# Patient Record
Sex: Female | Born: 1960 | Race: Black or African American | Hispanic: No | Smoking: Never smoker
Health system: Southern US, Community
[De-identification: ages and names within clinical notes are randomized; demographics above are authoritative.]

## PROBLEM LIST (undated history)

## (undated) DIAGNOSIS — F419 Anxiety disorder, unspecified: Secondary | ICD-10-CM

## (undated) HISTORY — PX: TUBAL LIGATION: SHX77

---

## 2001-07-02 ENCOUNTER — Encounter: Payer: Self-pay | Admitting: Family Medicine

## 2001-07-02 ENCOUNTER — Encounter: Admission: RE | Admit: 2001-07-02 | Discharge: 2001-07-02 | Payer: Self-pay | Admitting: Family Medicine

## 2004-11-18 ENCOUNTER — Emergency Department: Payer: Self-pay | Admitting: Emergency Medicine

## 2007-01-01 ENCOUNTER — Other Ambulatory Visit: Payer: Self-pay

## 2007-01-01 ENCOUNTER — Emergency Department: Payer: Self-pay | Admitting: Emergency Medicine

## 2007-10-24 ENCOUNTER — Emergency Department: Payer: Self-pay | Admitting: Internal Medicine

## 2007-10-24 ENCOUNTER — Other Ambulatory Visit: Payer: Self-pay

## 2008-02-25 ENCOUNTER — Emergency Department: Payer: Self-pay | Admitting: Emergency Medicine

## 2008-02-27 ENCOUNTER — Emergency Department: Payer: Self-pay | Admitting: Emergency Medicine

## 2008-02-28 ENCOUNTER — Emergency Department: Payer: Self-pay | Admitting: Emergency Medicine

## 2010-08-13 ENCOUNTER — Emergency Department (HOSPITAL_COMMUNITY): Admission: EM | Admit: 2010-08-13 | Discharge: 2010-08-13 | Payer: Self-pay | Admitting: Family Medicine

## 2010-08-13 ENCOUNTER — Emergency Department (HOSPITAL_COMMUNITY): Admission: EM | Admit: 2010-08-13 | Discharge: 2010-08-14 | Payer: Self-pay | Admitting: Emergency Medicine

## 2010-10-29 ENCOUNTER — Emergency Department (HOSPITAL_COMMUNITY)
Admission: EM | Admit: 2010-10-29 | Discharge: 2010-10-29 | Payer: Self-pay | Source: Home / Self Care | Admitting: Emergency Medicine

## 2011-01-08 LAB — POCT URINALYSIS DIPSTICK
Bilirubin Urine: NEGATIVE
Glucose, UA: NEGATIVE mg/dL
Hgb urine dipstick: NEGATIVE
Nitrite: NEGATIVE
Protein, ur: NEGATIVE mg/dL
Specific Gravity, Urine: 1.015 (ref 1.005–1.030)
Urobilinogen, UA: 0.2 mg/dL (ref 0.0–1.0)
pH: 6.5 (ref 5.0–8.0)

## 2011-01-10 LAB — POCT I-STAT, CHEM 8
BUN: 12 mg/dL (ref 6–23)
Calcium, Ion: 1.13 mmol/L (ref 1.12–1.32)
Chloride: 105 mEq/L (ref 96–112)
Creatinine, Ser: 0.9 mg/dL (ref 0.4–1.2)
Glucose, Bld: 91 mg/dL (ref 70–99)
HCT: 38 % (ref 36.0–46.0)
Hemoglobin: 12.9 g/dL (ref 12.0–15.0)
Potassium: 4.3 mEq/L (ref 3.5–5.1)
Sodium: 140 mEq/L (ref 135–145)
TCO2: 26 mmol/L (ref 0–100)

## 2011-01-10 LAB — CK: Total CK: 498 U/L — ABNORMAL HIGH (ref 7–177)

## 2011-09-11 ENCOUNTER — Emergency Department: Payer: Self-pay | Admitting: Internal Medicine

## 2012-12-10 ENCOUNTER — Emergency Department (HOSPITAL_COMMUNITY)
Admission: EM | Admit: 2012-12-10 | Discharge: 2012-12-10 | Disposition: A | Discharge status: Home / Self Care | Payer: Self-pay | Attending: Emergency Medicine | Admitting: Emergency Medicine

## 2012-12-10 ENCOUNTER — Encounter (HOSPITAL_COMMUNITY): Payer: Self-pay | Admitting: Emergency Medicine

## 2012-12-10 DIAGNOSIS — Z7982 Long term (current) use of aspirin: Secondary | ICD-10-CM | POA: Insufficient documentation

## 2012-12-10 DIAGNOSIS — M5432 Sciatica, left side: Secondary | ICD-10-CM

## 2012-12-10 DIAGNOSIS — Z79899 Other long term (current) drug therapy: Secondary | ICD-10-CM | POA: Insufficient documentation

## 2012-12-10 DIAGNOSIS — Z8659 Personal history of other mental and behavioral disorders: Secondary | ICD-10-CM | POA: Insufficient documentation

## 2012-12-10 DIAGNOSIS — M5430 Sciatica, unspecified side: Secondary | ICD-10-CM | POA: Insufficient documentation

## 2012-12-10 HISTORY — DX: Anxiety disorder, unspecified: F41.9

## 2012-12-10 MED ORDER — IBUPROFEN 800 MG PO TABS
800.0000 mg | ORAL_TABLET | Freq: Once | ORAL | Status: AC
  Administered 2012-12-10: 800 mg via ORAL
  Filled 2012-12-10: qty 1

## 2012-12-10 MED ORDER — DIAZEPAM 5 MG PO TABS: 2.5000 mg | ORAL_TABLET | Freq: Two times a day (BID) | ORAL | Status: DC

## 2012-12-10 MED ORDER — IBUPROFEN 800 MG PO TABS: 800.0000 mg | ORAL_TABLET | Freq: Three times a day (TID) | ORAL | Status: DC

## 2012-12-10 MED ORDER — DIAZEPAM 2 MG PO TABS
2.0000 mg | ORAL_TABLET | Freq: Once | ORAL | Status: AC
  Administered 2012-12-10: 2 mg via ORAL
  Filled 2012-12-10: qty 1

## 2012-12-10 NOTE — ED Notes (Signed)
Pt reports yesterday afternoon she began to have left lower back/hip pain radiating down her L leg. She is unable to put pressure on that leg.

## 2012-12-10 NOTE — ED Provider Notes (Signed)
History     CSN: [id number]  Arrival date & time 12/10/12  4098   First MD Initiated Contact with Patient 12/10/12 0407      Chief Complaint  Patient presents with  . Back Pain    (Consider location/radiation/quality/duration/timing/severity/associated sxs/prior treatment) Patient is a 52 y.o. female presenting with back pain.  Back Pain Associated symptoms: no abdominal pain, no chest pain, no dysuria, no fever and no headaches    Hx per PT. L sided back pain onset last, feels sharp in quality 7-8/10, radiates to butt/ hip/ leg. Worse with standing, walking, moving. No weakness. No F/C. Has not taken anything for this, no recalled trauma. 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 Is not diabetic. Pain constant since onset. No incontinence. No saddle paraesthesias  Past Medical History  Diagnosis Date  . Anxiety     Past Surgical History  Procedure Laterality Date  . Cesarean section      No family history on file.  History  Substance Use Topics  . Smoking status: Never Smoker   . Smokeless tobacco: Not on file  . Alcohol Use: No    OB History   Grav Para Term Preterm Abortions TAB SAB Ect Mult Living                  Review of Systems  Constitutional: Negative for fever and chills.  HENT: Negative for neck pain and neck stiffness.   Eyes: Negative for pain.  Respiratory: Negative for shortness of breath.   Cardiovascular: Negative for chest pain.  Gastrointestinal: Negative for abdominal pain.  Genitourinary: Negative for dysuria.  Musculoskeletal: Positive for back pain.  Skin: Negative for rash.  Neurological: Negative for headaches.  All other systems reviewed and are negative.    Allergies  Review of patient's allergies indicates no known allergies.  Home Medications   Current Outpatient Rx  Name  Route  Sig  Dispense  Refill  . aspirin EC 81 MG tablet   Oral   Take 81 mg by mouth daily.         Marland Kitchen GARCINIA  CAMBOGIA-CHROMIUM PO   Oral   Take 1 tablet by mouth daily.         . Multiple Vitamin (MULTIVITAMIN WITH MINERALS) TABS   Oral   Take 1 tablet by mouth daily.         Marland Kitchen OVER THE COUNTER MEDICATION   Oral   Take 1 capsule by mouth daily. tumeric           BP 129/93  Pulse 88  Temp(Src) 98.3 F (36.8 C) (Oral)  Resp 18  SpO2 100%  Physical Exam  Constitutional: She is oriented to person, place, and time. She appears well-developed and well-nourished.  HENT:  Head: Normocephalic and atraumatic.  Eyes: EOM are normal. Pupils are equal, round, and reactive to light.  Neck: Neck supple.  Cardiovascular: Normal rate, regular rhythm and intact distal pulses.   Pulmonary/Chest: Effort normal and breath sounds normal. No respiratory distress.  Abdominal: Soft. Bowel sounds are normal. She exhibits no distension. There is no tenderness.  Musculoskeletal: Normal range of motion. She exhibits no edema.  Tender over left sciatic area that reproduces her symptoms with palpation. No LE deficits with equal DTRs, strengths and sensorium to light touch. No midline lumbar tenderness, no CVAT  Neurological: She is alert and oriented to person, place, and time.  Skin: Skin is warm and dry.  ED Course  Procedures (including critical care time)  Motrin and Valium provided  On recheck starting to feel better. Sciatica precautions provided. Plan close outpatient followup  MDM  L sciatic pain and reproducible tenderness.   Medications provided  No indication for imaging based on clinical presentation/ exam  VS and nursing notes reviewed.         Sunnie Nielsen, MD 12/10/12 [phone]

## 2014-04-13 ENCOUNTER — Encounter (HOSPITAL_COMMUNITY): Payer: Self-pay | Admitting: *Deleted

## 2014-04-13 ENCOUNTER — Inpatient Hospital Stay (HOSPITAL_COMMUNITY)
Admission: AD | Admit: 2014-04-13 | Discharge: 2014-04-13 | Disposition: A | Discharge status: Home / Self Care | Payer: 59 | Source: Ambulatory Visit | Attending: Obstetrics & Gynecology | Admitting: Obstetrics & Gynecology

## 2014-04-13 DIAGNOSIS — N950 Postmenopausal bleeding: Secondary | ICD-10-CM | POA: Insufficient documentation

## 2014-04-13 DIAGNOSIS — M549 Dorsalgia, unspecified: Secondary | ICD-10-CM | POA: Diagnosis not present

## 2014-04-13 LAB — URINALYSIS, ROUTINE W REFLEX MICROSCOPIC
BILIRUBIN URINE: NEGATIVE
GLUCOSE, UA: NEGATIVE mg/dL
KETONES UR: NEGATIVE mg/dL
Nitrite: NEGATIVE
PROTEIN: NEGATIVE mg/dL
Specific Gravity, Urine: 1.025 (ref 1.005–1.030)
Urobilinogen, UA: 0.2 mg/dL (ref 0.0–1.0)
pH: 5.5 (ref 5.0–8.0)

## 2014-04-13 LAB — WET PREP, GENITAL
Clue Cells Wet Prep HPF POC: NONE SEEN
TRICH WET PREP: NONE SEEN
YEAST WET PREP: NONE SEEN

## 2014-04-13 LAB — URINE MICROSCOPIC-ADD ON

## 2014-04-13 LAB — POCT PREGNANCY, URINE: Preg Test, Ur: NEGATIVE

## 2014-04-13 MED ORDER — IBUPROFEN 600 MG PO TABS
600.0000 mg | ORAL_TABLET | Freq: Once | ORAL | Status: AC
  Administered 2014-04-13: 600 mg via ORAL
  Filled 2014-04-13: qty 1

## 2014-04-13 MED ORDER — IBUPROFEN 600 MG PO TABS: 600.0000 mg | ORAL_TABLET | Freq: Three times a day (TID) | ORAL | Status: DC | PRN

## 2014-04-13 NOTE — Discharge Instructions (Signed)
Back Pain, Adult Low back pain is very common. About 1 in 5 people have back pain.The cause of low back pain is rarely dangerous. The pain often gets better over time.About half of people with a sudden onset of back pain feel better in just 2 weeks. About 8 in 10 people feel better by 6 weeks.  CAUSES Some common causes of back pain include:  Strain of the muscles or ligaments supporting the spine.  Wear and tear (degeneration) of the spinal discs.  Arthritis.  Direct injury to the back. DIAGNOSIS Most of the time, the direct cause of low back pain is not known.However, back pain can be treated effectively even when the exact cause of the pain is unknown.Answering your caregiver's questions about your overall health and symptoms is one of the most accurate ways to make sure the cause of your pain is not dangerous. If your caregiver needs more information, he or she may order lab work or imaging tests (X-rays or MRIs).However, even if imaging tests show changes in your back, this usually does not require surgery. HOME CARE INSTRUCTIONS For many people, back pain returns.Since low back pain is rarely dangerous, it is often a condition that people can learn to manageon their own.   Remain active. It is stressful on the back to sit or stand in one place. Do not sit, drive, or stand in one place for more than 30 minutes at a time. Take short walks on level surfaces as soon as pain allows.Try to increase the length of time you walk each day.  Do not stay in bed.Resting more than 1 or 2 days can delay your recovery.  Do not avoid exercise or work.Your body is made to move.It is not dangerous to be active, even though your back may hurt.Your back will likely heal faster if you return to being active before your pain is gone.  Pay attention to your body when you bend and lift. Many people have less discomfortwhen lifting if they bend their knees, keep the load close to their bodies,and  avoid twisting. Often, the most comfortable positions are those that put less stress on your recovering back.  Find a comfortable position to sleep. Use a firm mattress and lie on your side with your knees slightly bent. If you lie on your back, put a pillow under your knees.  Only take over-the-counter or prescription medicines as directed by your caregiver. Over-the-counter medicines to reduce pain and inflammation are often the most helpful.Your caregiver may prescribe muscle relaxant drugs.These medicines help dull your pain so you can more quickly return to your normal activities and healthy exercise.  Put ice on the injured area.  Put ice in a plastic bag.  Place a towel between your skin and the bag.  Leave the ice on for 15-20 minutes, 03-04 times a day for the first 2 to 3 days. After that, ice and heat may be alternated to reduce pain and spasms.  Ask your caregiver about trying back exercises and gentle massage. This may be of some benefit.  Avoid feeling anxious or stressed.Stress increases muscle tension and can worsen back pain.It is important to recognize when you are anxious or stressed and learn ways to manage it.Exercise is a great option. SEEK MEDICAL CARE IF:  You have pain that is not relieved with rest or medicine.  You have pain that does not improve in 1 week.  You have new symptoms.  You are generally not feeling well. SEEK   IMMEDIATE MEDICAL CARE IF:   You have pain that radiates from your back into your legs.  You develop new bowel or bladder control problems.  You have unusual weakness or numbness in your arms or legs.  You develop nausea or vomiting.  You develop abdominal pain.  You feel faint. Document Released: 10/15/2005 Document Revised: 04/15/2012 Document Reviewed: 03/05/2011 Exit[employer] Patient Information 2014 [employer].  

## 2014-04-13 NOTE — MAU Note (Signed)
Patient states she is post menopausal and had not had a period in years. States she thought she had a yeast infection and used an OTC medication. States when she pulled the applicator out it had a little blood on it, no bleeding after. States she has pain down the back of the left hip and upper leg. Sometimes the right.

## 2014-04-13 NOTE — MAU Provider Note (Signed)
Attestation of Attending Supervision of Advanced Practitioner (PA/CNM/NP): Evaluation and management procedures were performed by the Advanced Practitioner under my supervision and collaboration.  I have reviewed the Advanced Practitioner's note and chart, and I agree with the management and plan.  Tulani Kidney, MD, FACOG Attending Obstetrician & Gynecologist Faculty Practice, [facility]  

## 2014-04-13 NOTE — MAU Provider Note (Signed)
History     CSN: [id number]  Arrival date and time: 04/13/14 1034   First Pam Hayes Initiated Contact with Patient 04/13/14 1140      Chief Complaint  Patient presents with  . Vaginal Bleeding   Vaginal Bleeding Associated symptoms include joint pain (hip pain). Pertinent negatives include no abdominal pain, dysuria, hematuria or urgency.    Pt is 53 yo postmenopausal x 5 years here with report of possible yeast infection and used an OTC medication. Denies having any history of vaginal itching.  Treated yeast infection just "in case" due to the back pain.  +clear vaginal discharge, no odor noted.  States when she pulled the applicator out it had a little blood on it.  No report of bleeding since.  Reports "back going out" three times in the past month.  Pain is in the left hip and upper leg that increases with movement.   Pain is rated a 4/10.   No change in bowel or bladder pattern.  History of bladder stress incontinence.  Here primarily for back pain.  No abnormal female genital issues reported.  Last pap smear      Past Medical History  Diagnosis Date  . Anxiety     Past Surgical History  Procedure Laterality Date  . Cesarean section      History reviewed. No pertinent family history.  History  Substance Use Topics  . Smoking status: Never Smoker   . Smokeless tobacco: Never Used  . Alcohol Use: No    Allergies: No Known Allergies  Prescriptions prior to admission  Medication Sig Dispense Refill  . BEE POLLEN PO Take 1-2 tablets by mouth daily.      . Multiple Vitamin (MULTIVITAMIN WITH MINERALS) TABS Take 1 tablet by mouth daily.      Marland Kitchen. OVER THE COUNTER MEDICATION Take 1 capsule by mouth 2 (two) times daily. Pt takes a mediciation called Teehui. (it is an algae or seaweed supplement)        Review of Systems  Gastrointestinal: Negative for abdominal pain.  Genitourinary: Positive for vaginal bleeding. Negative for dysuria, urgency and hematuria.   Incontinence  Musculoskeletal: Positive for joint pain (hip pain).       Leg pain  All other systems reviewed and are negative.  Physical Exam   Blood pressure 111/79, pulse 77, temperature 98.1 F (36.7 C), temperature source Oral, resp. rate 16, height 5' (1.524 m), weight 92.352 kg (203 lb 9.6 oz), SpO2 98.00%.  Physical Exam  Constitutional: She is oriented to person, place, and time. She appears well-developed and well-nourished. No distress.  HENT:  Head: Normocephalic.  Neck: Normal range of motion. Neck supple.  Cardiovascular: Normal rate, regular rhythm and normal heart sounds.   Respiratory: Effort normal and breath sounds normal. No respiratory distress.  GI: Soft.  Genitourinary: Uterus is not enlarged. Cervix exhibits no motion tenderness, no discharge and no friability. Right adnexum displays no mass and no tenderness. Left adnexum displays no mass and no tenderness. No bleeding around the vagina. No vaginal discharge found.  Musculoskeletal: She exhibits no edema.       Lumbar back: She exhibits tenderness.  Pain increase in left hip with right lateral bend  Neurological: She is alert and oriented to person, place, and time. She has normal reflexes.  Skin: Skin is warm and dry.    MAU Course  Procedures Results for orders placed during the hospital encounter of 04/13/14 (from the past 24 hour(s))  URINALYSIS, ROUTINE W  REFLEX MICROSCOPIC     Status: Abnormal   Collection Time    04/13/14 11:05 AM      Result Value Ref Range   Color, Urine YELLOW  YELLOW   APPearance CLEAR  CLEAR   Specific Gravity, Urine 1.025  1.005 - 1.030   pH 5.5  5.0 - 8.0   Glucose, UA NEGATIVE  NEGATIVE mg/dL   Hgb urine dipstick LARGE (*) NEGATIVE   Bilirubin Urine NEGATIVE  NEGATIVE   Ketones, ur NEGATIVE  NEGATIVE mg/dL   Protein, ur NEGATIVE  NEGATIVE mg/dL   Urobilinogen, UA 0.2  0.0 - 1.0 mg/dL   Nitrite NEGATIVE  NEGATIVE   Leukocytes, UA SMALL (*) NEGATIVE  URINE  MICROSCOPIC-ADD ON     Status: Abnormal   Collection Time    04/13/14 11:05 AM      Result Value Ref Range   Squamous Epithelial / LPF FEW (*) RARE   WBC, UA 3-6  <3 WBC/hpf   RBC / HPF 3-6  <3 RBC/hpf   Bacteria, UA RARE  RARE   Urine-Other MUCOUS PRESENT    POCT PREGNANCY, URINE     Status: None   Collection Time    04/13/14 11:16 AM      Result Value Ref Range   Preg Test, Ur NEGATIVE  NEGATIVE  WET PREP, GENITAL     Status: Abnormal   Collection Time    04/13/14 12:26 PM      Result Value Ref Range   Yeast Wet Prep HPF POC NONE SEEN  NONE SEEN   Trich, Wet Prep NONE SEEN  NONE SEEN   Clue Cells Wet Prep HPF POC NONE SEEN  NONE SEEN   WBC, Wet Prep HPF POC MODERATE (*) NONE SEEN     Assessment and Plan  Back Pain  Plan: Discharge to home [facility] RX Ibuprofen #20 Referral to GYN clinic for Well Woman Exam  Pam Hayes,WALIDAH 04/13/2014, 11:41 AM

## 2014-04-14 LAB — GC/CHLAMYDIA PROBE AMP
CT Probe RNA: NEGATIVE
GC Probe RNA: NEGATIVE

## 2014-08-30 ENCOUNTER — Encounter (HOSPITAL_COMMUNITY): Payer: Self-pay | Admitting: *Deleted

## 2017-02-27 ENCOUNTER — Encounter (HOSPITAL_BASED_OUTPATIENT_CLINIC_OR_DEPARTMENT_OTHER): Payer: Self-pay | Admitting: *Deleted

## 2017-02-27 ENCOUNTER — Emergency Department (HOSPITAL_BASED_OUTPATIENT_CLINIC_OR_DEPARTMENT_OTHER)
Admission: EM | Admit: 2017-02-27 | Discharge: 2017-02-28 | Disposition: A | Discharge status: Home / Self Care | Payer: Self-pay | Attending: Emergency Medicine | Admitting: Emergency Medicine

## 2017-02-27 DIAGNOSIS — M533 Sacrococcygeal disorders, not elsewhere classified: Secondary | ICD-10-CM | POA: Insufficient documentation

## 2017-02-27 DIAGNOSIS — N390 Urinary tract infection, site not specified: Secondary | ICD-10-CM | POA: Insufficient documentation

## 2017-02-27 NOTE — ED Triage Notes (Signed)
Pt c/o painful freq urination x 1 day 

## 2017-02-28 LAB — URINALYSIS, ROUTINE W REFLEX MICROSCOPIC
Bilirubin Urine: NEGATIVE
GLUCOSE, UA: NEGATIVE mg/dL
Ketones, ur: NEGATIVE mg/dL
Nitrite: NEGATIVE
PH: 7 (ref 5.0–8.0)
PROTEIN: NEGATIVE mg/dL
Specific Gravity, Urine: 1.004 — ABNORMAL LOW (ref 1.005–1.030)

## 2017-02-28 LAB — URINALYSIS, MICROSCOPIC (REFLEX)

## 2017-02-28 MED ORDER — NAPROXEN 375 MG PO TABS: 375.0000 mg | ORAL_TABLET | Freq: Two times a day (BID) | ORAL | 0 refills | Status: DC | PRN

## 2017-02-28 MED ORDER — NAPROXEN 250 MG PO TABS
500.0000 mg | ORAL_TABLET | Freq: Once | ORAL | Status: AC
  Administered 2017-02-28: 500 mg via ORAL
  Filled 2017-02-28: qty 2

## 2017-02-28 MED ORDER — CEPHALEXIN 250 MG PO CAPS
1000.0000 mg | ORAL_CAPSULE | Freq: Once | ORAL | Status: AC
  Administered 2017-02-28: 1000 mg via ORAL
  Filled 2017-02-28: qty 4

## 2017-02-28 MED ORDER — PHENAZOPYRIDINE HCL 100 MG PO TABS
200.0000 mg | ORAL_TABLET | Freq: Once | ORAL | Status: AC
  Administered 2017-02-28: 200 mg via ORAL
  Filled 2017-02-28: qty 2

## 2017-02-28 MED ORDER — CEPHALEXIN 500 MG PO CAPS: 500.0000 mg | ORAL_CAPSULE | Freq: Two times a day (BID) | ORAL | 0 refills | Status: DC

## 2017-02-28 MED ORDER — PHENAZOPYRIDINE HCL 200 MG PO TABS: 200.0000 mg | ORAL_TABLET | Freq: Three times a day (TID) | ORAL | 0 refills | Status: DC

## 2017-02-28 NOTE — ED Provider Notes (Signed)
[facility] Provider Note: Lowella DellJ. Lane Chistian Kasler, MD, FACEP  CSN: [id number] MRN: [id number] ARRIVAL: 02/27/17 at 2337 ROOM: MH05/[facility]05   CHIEF COMPLAINT  Dysuria   HISTORY OF PRESENT ILLNESS  Pam Hayes is a 56 y.o. female who developed dysuria yesterday. She states her symptoms worsened throughout the day and are now "really bad". She describes the dysuria as burning with urination. She is having urinary frequency, urinary urgency and urinary incontinence. She is having chills but no fever. She denies flank pain. She denies nausea or vomiting. She has having pain in her left sacroiliac joint but thinks this is due to bending and lifting at work.   Past Medical History:  Diagnosis Date  . Anxiety     Past Surgical History:  Procedure Laterality Date  . CESAREAN SECTION    . TUBAL LIGATION      History reviewed. No pertinent family history.  Social History  Substance Use Topics  . Smoking status: Never Smoker  . Smokeless tobacco: Never Used  . Alcohol use No    Prior to Admission medications   Medication Sig Start Date End Date Taking? Authorizing Provider  BEE POLLEN PO Take 1-2 tablets by mouth daily.    Historical Provider, MD  ibuprofen (ADVIL,MOTRIN) 600 MG tablet Take 1 tablet (600 mg total) by mouth every 8 (eight) hours as needed. 04/13/14   Marlis EdelsonWalidah N Karim, CNM  Multiple Vitamin (MULTIVITAMIN WITH MINERALS) TABS Take 1 tablet by mouth daily.    Historical Provider, MD  OVER THE COUNTER MEDICATION Take 1 capsule by mouth 2 (two) times daily. Pt takes a mediciation called Teehui. (it is an algae or seaweed supplement)    Historical Provider, MD    Allergies Patient has no known allergies.   REVIEW OF SYSTEMS  Negative except as noted here or in the History of Present Illness.   PHYSICAL EXAMINATION  Initial Vital Signs Blood pressure (!) 146/82, pulse 86, temperature 98.7 F (37.1 C), resp. rate 18, height 5' (1.524 m), weight 190 lb (86.2 kg), SpO2  98 %.  Examination General: Well-developed, well-nourished female in no acute distress; appearance consistent with age of record HENT: normocephalic; atraumatic Eyes: pupils equal, round and reactive to light; extraocular muscles intact Neck: supple Heart: regular rate and rhythm Lungs: clear to auscultation bilaterally Abdomen: soft; nondistended; nontender; no masses or hepatosplenomegaly; bowel sounds present GU: No CVA tenderness Back: Left SI tenderness Extremities: No deformity; full range of motion; pulses normal Neurologic: Awake, alert and oriented; motor function intact in all extremities and symmetric; no facial droop Skin: Warm and dry Psychiatric: Normal mood and affect   RESULTS  Summary of this visit's results, reviewed by myself:   EKG Interpretation  Date/Time:    Ventricular Rate:    PR Interval:    QRS Duration:   QT Interval:    QTC Calculation:   R Axis:     Text Interpretation:        Laboratory Studies: Results for orders placed or performed during the hospital encounter of 02/27/17 (from the past 24 hour(s))  Urinalysis, Routine w reflex microscopic     Status: Abnormal   Collection Time: 02/27/17 11:40 PM  Result Value Ref Range   Color, Urine YELLOW YELLOW   APPearance CLOUDY (A) CLEAR   Specific Gravity, Urine 1.004 (L) 1.005 - 1.030   pH 7.0 5.0 - 8.0   Glucose, UA NEGATIVE NEGATIVE mg/dL   Hgb urine dipstick LARGE (A) NEGATIVE   Bilirubin Urine  NEGATIVE NEGATIVE   Ketones, ur NEGATIVE NEGATIVE mg/dL   Protein, ur NEGATIVE NEGATIVE mg/dL   Nitrite NEGATIVE NEGATIVE   Leukocytes, UA LARGE (A) NEGATIVE  Urinalysis, Microscopic (reflex)     Status: Abnormal   Collection Time: 02/27/17 11:40 PM  Result Value Ref Range   RBC / HPF 6-30 0 - 5 RBC/hpf   WBC, UA TOO NUMEROUS TO COUNT 0 - 5 WBC/hpf   Bacteria, UA MANY (A) NONE SEEN   Squamous Epithelial / LPF 0-5 (A) NONE SEEN   Imaging Studies: No results found.  ED COURSE  Nursing  notes and initial vitals signs, including pulse oximetry, reviewed.  Vitals:   02/27/17 2342 02/27/17 2345  BP:  (!) 146/82  Pulse:  86  Resp:  18  Temp:  98.7 F (37.1 C)  SpO2:  98%  Weight: 190 lb (86.2 kg)   Height: 5' (1.524 m)     PROCEDURES    ED DIAGNOSES     ICD-9-CM ICD-10-CM   1. Lower urinary tract infectious disease 599.0 N39.0   2. Sacroiliac joint pain 724.6 M53.3        Paula Libra, MD 02/28/17 [phone]

## 2017-03-02 LAB — URINE CULTURE

## 2017-03-03 ENCOUNTER — Telehealth: Payer: Self-pay

## 2017-03-03 NOTE — Progress Notes (Signed)
ED Antimicrobial Stewardship Positive Culture Follow Up   Pam Hayes is an 56 y.o. female who [facility] with a chief complaint of  Chief Complaint  Patient presents with  . Dysuria    Recent Results (from the past 720 hour(s))  Urine culture     Status: Abnormal   Collection Time: 02/27/17 11:40 PM  Result Value Ref Range Status   Specimen Description URINE, RANDOM  Final   Special Requests NONE  Final   Culture (A)  Final    40,000 COLONIES/mL ESCHERICHIA COLI Confirmed Extended Spectrum Beta-Lactamase Producer (ESBL) Performed at [facility], [address] [place], [place] [address]    Report Status 03/02/2017 FINAL  Final   Organism ID, Bacteria ESCHERICHIA COLI (A)  Final      Susceptibility   Escherichia coli - MIC*    AMPICILLIN >=32 RESISTANT Resistant     CEFAZOLIN >=64 RESISTANT Resistant     CEFTRIAXONE >=64 RESISTANT Resistant     CIPROFLOXACIN >=4 RESISTANT Resistant     GENTAMICIN <=1 SENSITIVE Sensitive     IMIPENEM <=0.25 SENSITIVE Sensitive     NITROFURANTOIN 64 INTERMEDIATE Intermediate     TRIMETH/SULFA <=20 SENSITIVE Sensitive     AMPICILLIN/SULBACTAM >=32 RESISTANT Resistant     PIP/TAZO <=4 SENSITIVE Sensitive     Extended ESBL POSITIVE Resistant     * 40,000 COLONIES/mL ESCHERICHIA COLI    [x]  Treated with cephalexin, organism resistant to prescribed antimicrobial []  Patient discharged originally without antimicrobial agent and treatment is now indicated  New antibiotic prescription: dc cephalexin - start bactrim ds 1 tab bid x 3 d  ED Provider: Arthor CaptainAbigail Harris, PA-C  Bertram MillardMichael A Terrence Wishon 03/03/2017, 8:41 AM Infectious Diseases Pharmacist Phone# [phone]

## 2017-03-03 NOTE — Telephone Encounter (Signed)
Post ED Visit - Positive Culture Follow-up: Successful Patient Follow-Up  Culture assessed and recommendations reviewed by: []  Enzo BiNathan Batchelder, Pharm.D. []  Celedonio MiyamotoJeremy Frens, [address] BCPS AQ-ID [x]  Garvin FilaMike Maccia, Pharm.D., BCPS []  Georgina PillionElizabeth Martin, Pharm.D., BCPS []  RichmondMinh Pham, [address]Ph[address] BCPS, AAHIVP []  Estella HuskMichelle Turner, Pharm.D., BCPS, AAHIVP []  Lysle Pearlachel Rumbarger, PharmD, BCPS []  Casilda Carlsaylor Stone, PharmD, BCPS []  Pollyann SamplesAndy Johnston, PharmD, BCPS  Positive urine culture  []  Patient discharged without antimicrobial prescription and treatment is now indicated [x]  Organism is resistant to prescribed ED discharge antimicrobial []  Patient with positive blood cultures  Changes discussed with ED provider: [facility]bigail [facility] New [facility] prescription Bactrim DS 1 BID x 3 days [facility] [phone]  Contacted patient, date 03/03/17, time 1013   Pam Hayes 03/03/2017, 10:12 AM

## 2019-11-09 ENCOUNTER — Ambulatory Visit: Payer: Self-pay | Attending: Internal Medicine

## 2019-11-09 DIAGNOSIS — Z20822 Contact with and (suspected) exposure to covid-19: Secondary | ICD-10-CM

## 2019-11-10 LAB — NOVEL CORONAVIRUS, NAA: SARS-CoV-2, NAA: NOT DETECTED

## 2021-10-17 ENCOUNTER — Other Ambulatory Visit: Payer: Self-pay | Admitting: Otolaryngology

## 2021-10-17 DIAGNOSIS — E041 Nontoxic single thyroid nodule: Secondary | ICD-10-CM

## 2021-12-06 ENCOUNTER — Ambulatory Visit: Payer: Self-pay

## 2021-12-11 ENCOUNTER — Ambulatory Visit
Admission: RE | Admit: 2021-12-11 | Discharge: 2021-12-11 | Disposition: A | Discharge status: Home / Self Care | Payer: Self-pay | Source: Ambulatory Visit | Attending: Otolaryngology | Admitting: Otolaryngology

## 2021-12-11 ENCOUNTER — Other Ambulatory Visit: Payer: Self-pay

## 2021-12-11 DIAGNOSIS — E041 Nontoxic single thyroid nodule: Secondary | ICD-10-CM | POA: Insufficient documentation

## 2021-12-13 ENCOUNTER — Other Ambulatory Visit: Payer: Self-pay | Admitting: Otolaryngology

## 2021-12-13 DIAGNOSIS — E041 Nontoxic single thyroid nodule: Secondary | ICD-10-CM

## 2021-12-20 ENCOUNTER — Ambulatory Visit: Admission: RE | Admit: 2021-12-20 | Payer: Self-pay | Source: Ambulatory Visit

## 2022-01-02 ENCOUNTER — Ambulatory Visit: Admission: RE | Admit: 2022-01-02 | Payer: PRIVATE HEALTH INSURANCE | Source: Ambulatory Visit

## 2022-01-10 ENCOUNTER — Ambulatory Visit
Admission: RE | Admit: 2022-01-10 | Discharge: 2022-01-10 | Disposition: A | Discharge status: Home / Self Care | Payer: 59 | Source: Ambulatory Visit | Attending: Otolaryngology | Admitting: Otolaryngology

## 2022-01-10 DIAGNOSIS — E041 Nontoxic single thyroid nodule: Secondary | ICD-10-CM | POA: Insufficient documentation

## 2022-01-10 NOTE — Procedures (Signed)
Successful US guided FNA of left inferior thyroid nodule ?No complications. ?See PACS for full report. ? ?Pattricia Boss D, PA-C ?01/10/2022, 2:09 PM ? ? ? ? ?

## 2022-01-11 LAB — CYTOLOGY - NON PAP

## 2023-11-05 IMAGING — US US THYROID
1 series · 13 of 25 positions shown · non-contrast
Comparison: None.

CLINICAL DATA: Incidental on MRI. Thyroid nodules noted on outside
MRI.

EXAM:
THYROID ULTRASOUND
TECHNIQUE: Ultrasound examination of the thyroid gland and adjacent soft
tissues was performed.

[Series 1: us thyroid · 0.07mm/px · 13 of 56 slices shown]
[im 1/56]
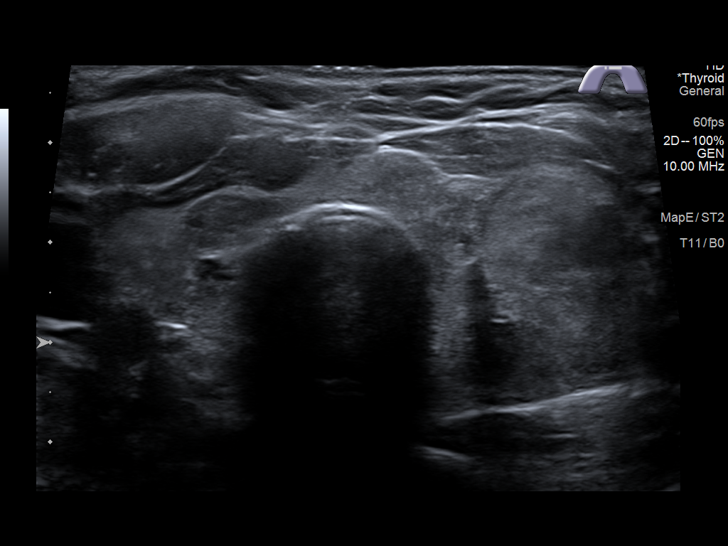
[im 5/56]
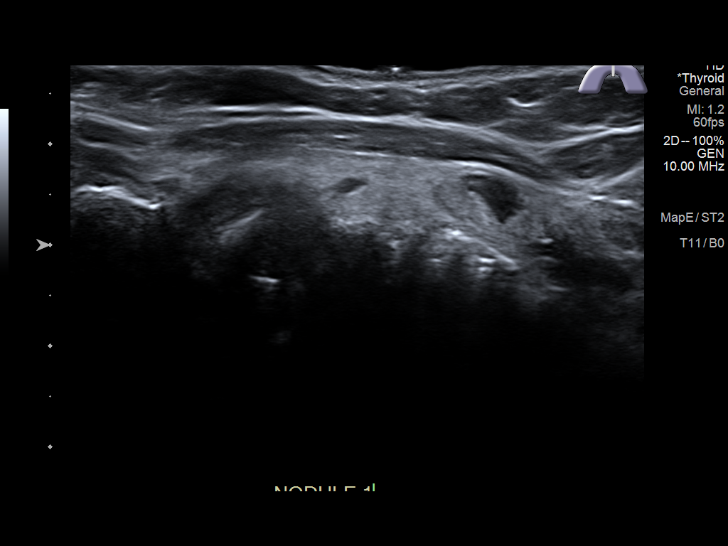
[im 10/56]
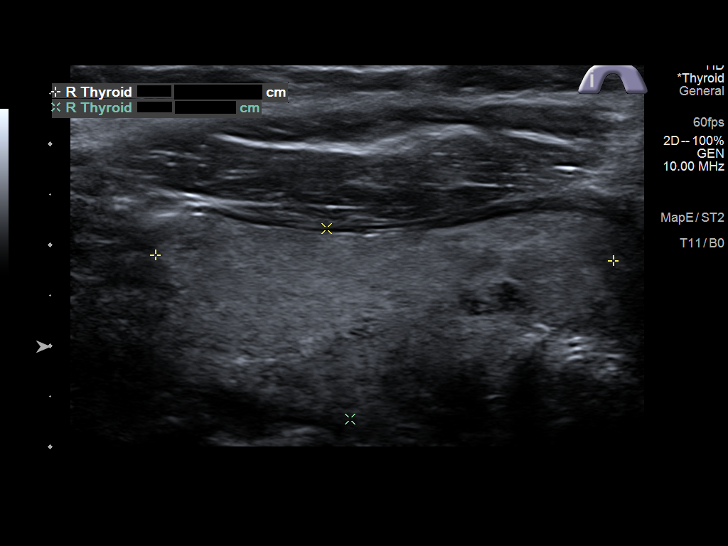
[im 14/56]
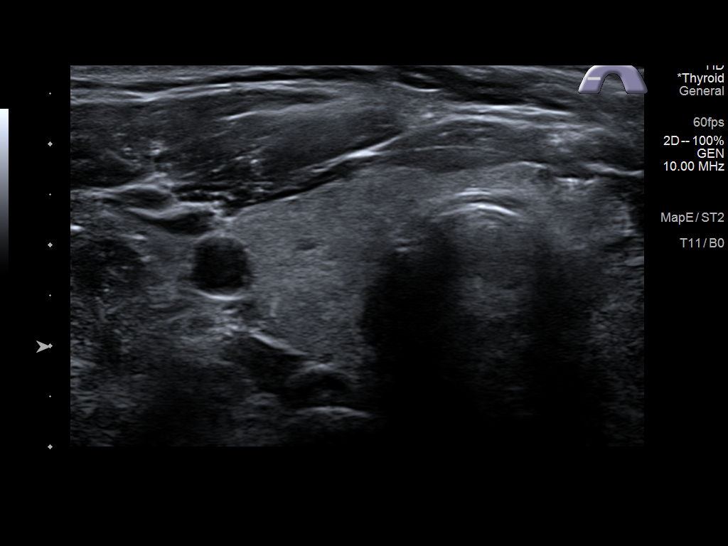
[im 19/56]
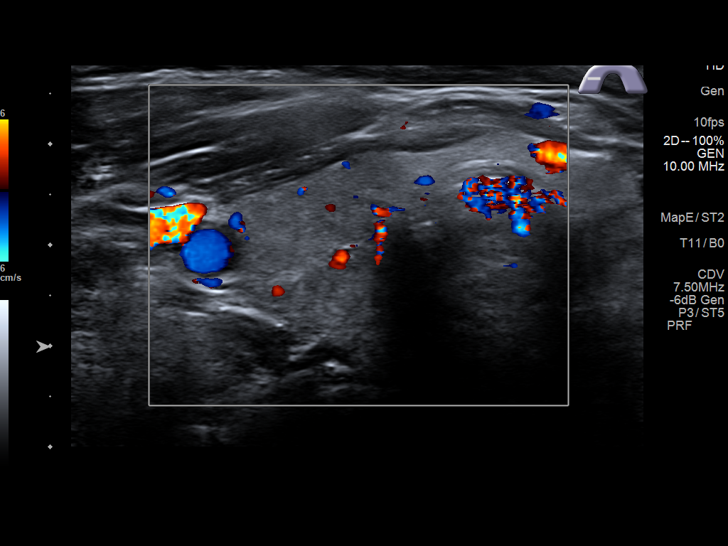
[im 23/56]
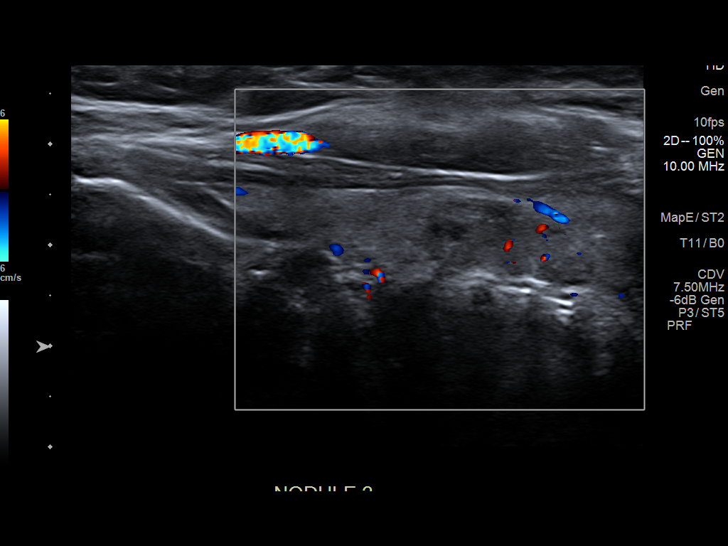
[im 28/56]
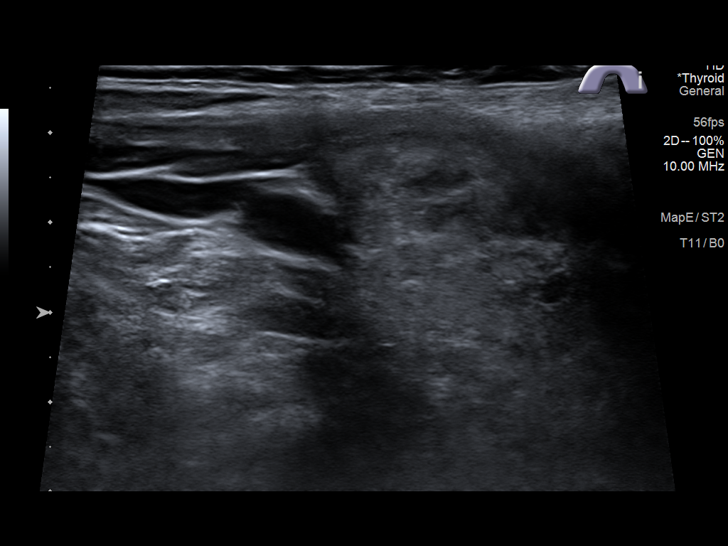
[im 33/56]
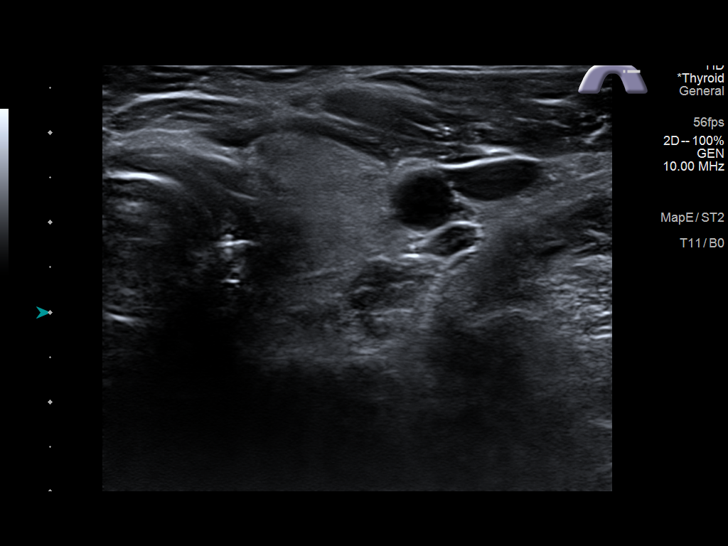
[im 37/56]
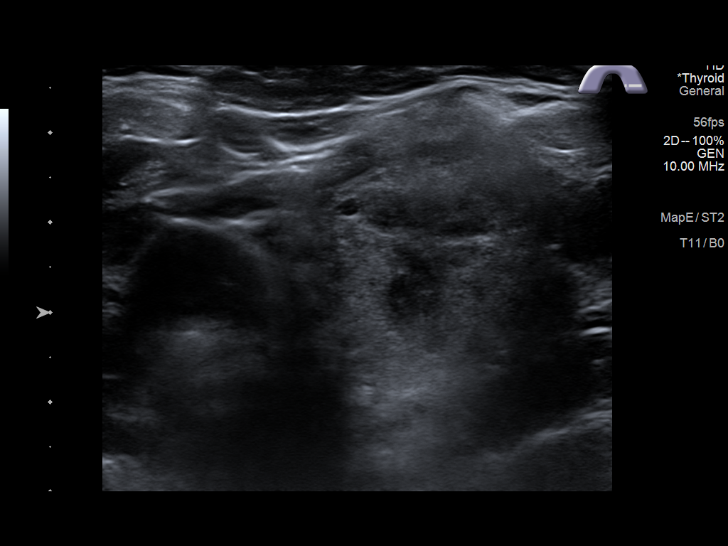
[im 42/56]
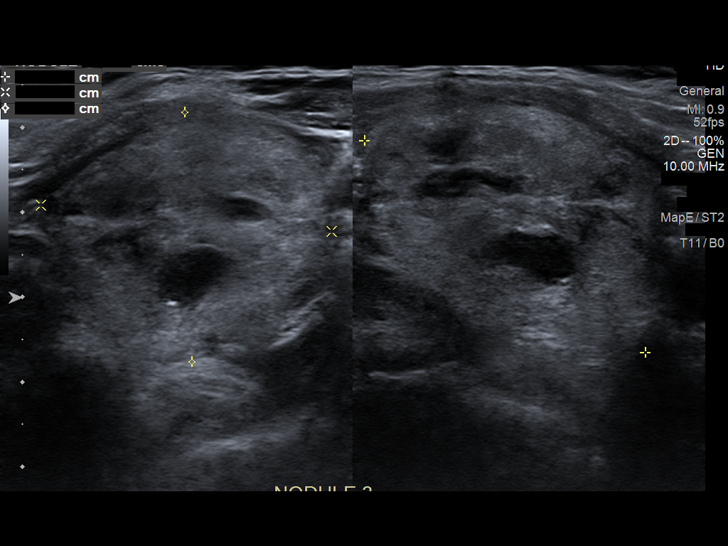
[im 46/56]
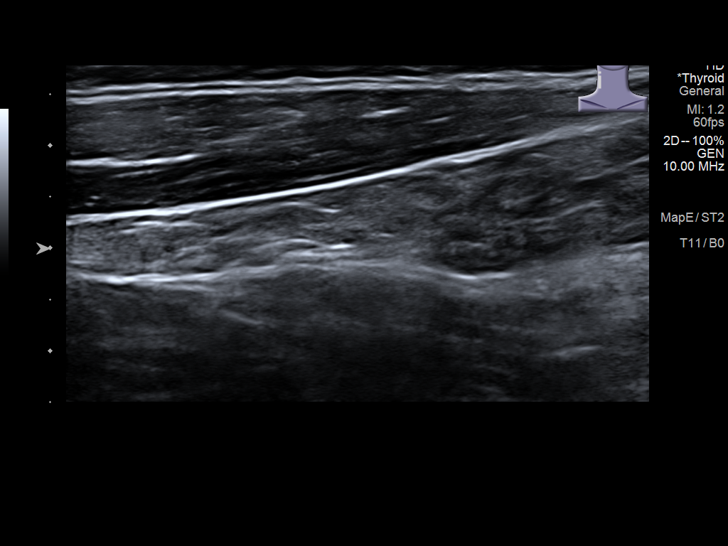
[im 51/56]
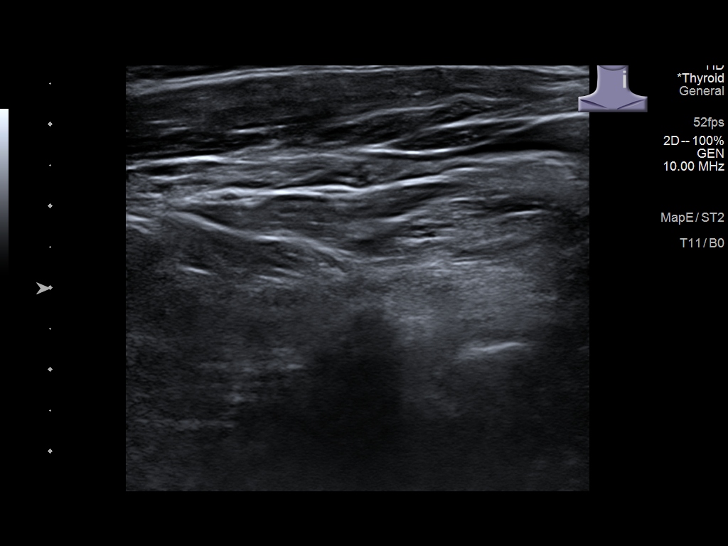
[im 56/56]
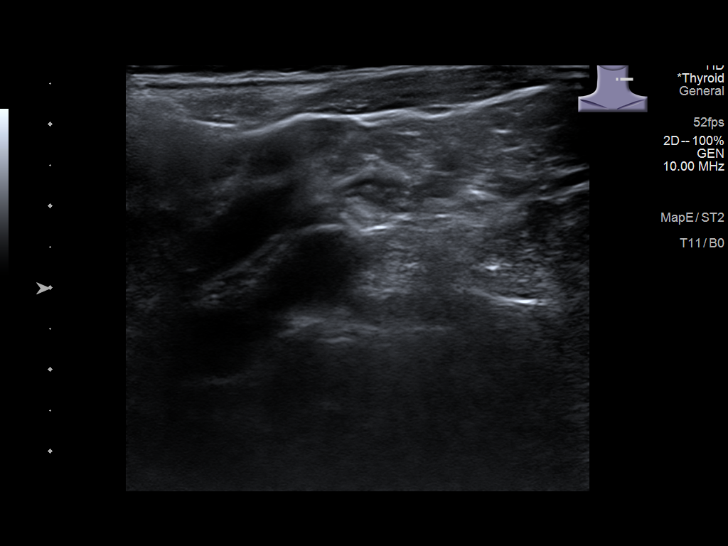

[13 of 25 positions shown; findings below may reference images not displayed]

FINDINGS: Parenchymal Echotexture: Moderately heterogenous

Isthmus: Normal in size measuring 0.5 cm in diameter

Right lobe: Normal in size measuring 5.5 x 1.9 x 1.8 cm

Left lobe: Borderline enlarged measuring 5.6 x 2.6 x 2.5 cm.

_________________________________________________________

Estimated total number of nodules >/= 1 cm: 2

Number of spongiform nodules >/=  2 cm not described below (TR1): 0

Number of mixed cystic and solid nodules >/= 1.5 cm not described
below (TR2): 0

_________________________________________________________

There is an approximately 0.8 x 0.6 x 0.5 cm mixed partially cystic,
partially solid nodule within left side of the thyroid isthmus
(labeled 1), which does not meet criteria to recommend percutaneous
sampling or continued dedicated follow-up.

_________________________________________________________

There is an approximately 1.5 x 1.1 x 0.6 cm isoechoic
spongiform/benign-appearing nodule within the mid, medial aspect the
right lobe of the thyroid (labeled 2), which does not meet criteria
to recommend percutaneous sampling or continued dedicated follow-up.

_________________________________________________________

Nodule # 3:

Location: Left; Inferior

Maximum size: 4.2 cm; Other 2 dimensions: 3.1 x 2.7 cm

Composition: solid/almost completely solid (2)

Echogenicity: isoechoic (1)

Shape: not taller-than-wide (0)

Margins: ill-defined (0)

Echogenic foci: none (0)

ACR TI-RADS total points: 3.

ACR TI-RADS risk category: TR3 (3 points).

ACR TI-RADS recommendations:

**Given size (>/= 2.5 cm) and appearance, fine needle aspiration of
this mildly suspicious nodule should be considered based on TI-RADS
criteria.

_________________________________________________________
IMPRESSION: 1. Borderline thyromegaly with findings suggestive of multinodular
goiter.
2. Nodule #3 meets imaging criteria to recommend percutaneous
sampling as indicated.
3. Neither of the additional discretely measured thyroid nodules
meet imaging criteria to recommend percutaneous sampling or
continued dedicated follow-up.

The above is in keeping with the ACR TI-RADS recommendations - [HOSPITAL] 7822;[DATE].

## 2023-12-05 IMAGING — US US FNA BIOPSY THYROID 1ST LESION
1 series · 11 of 11 positions shown · non-contrast
Comparison: Ultrasound thyroid 12/11/2021

MEDICATIONS:
Local 1% lidocaine only.

COMPLICATIONS:
None immediate.

INDICATION: Indeterminate thyroid nodule

EXAM:
ULTRASOUND GUIDED FINE NEEDLE ASPIRATION OF INDETERMINATE THYROID
NODULE
TECHNIQUE: Informed written consent was obtained from the patient after a
discussion of the risks, benefits and alternatives to treatment.
Questions regarding the procedure were encouraged and answered. A
timeout was performed prior to the initiation of the procedure.

[Series 1: us fna biopsy thyroid 1st lesion · 0.07mm/px · 11 acquisitions, 11 frames shown]
[im 1/11]
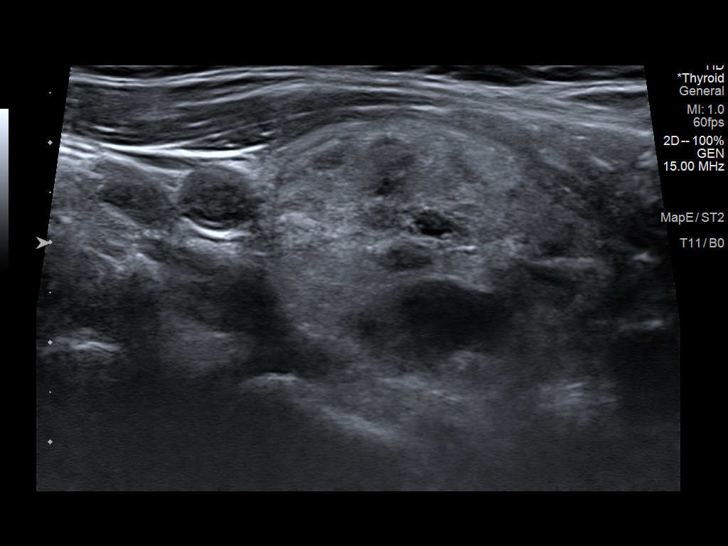
[im 2/11]
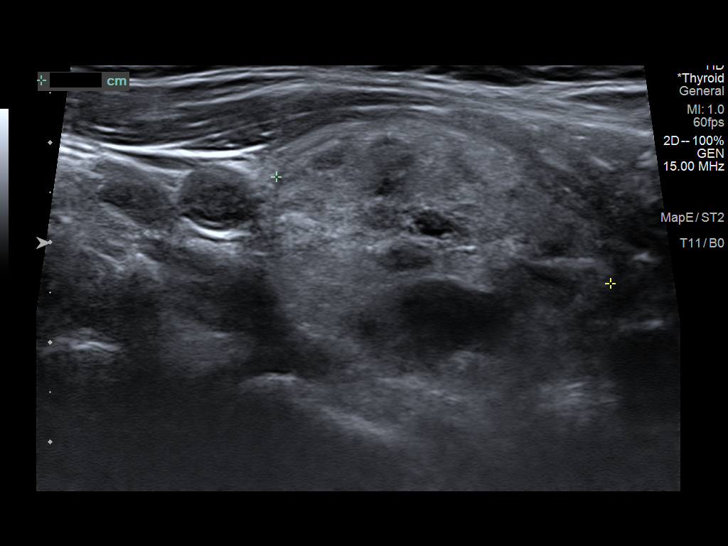
[im 3/11]
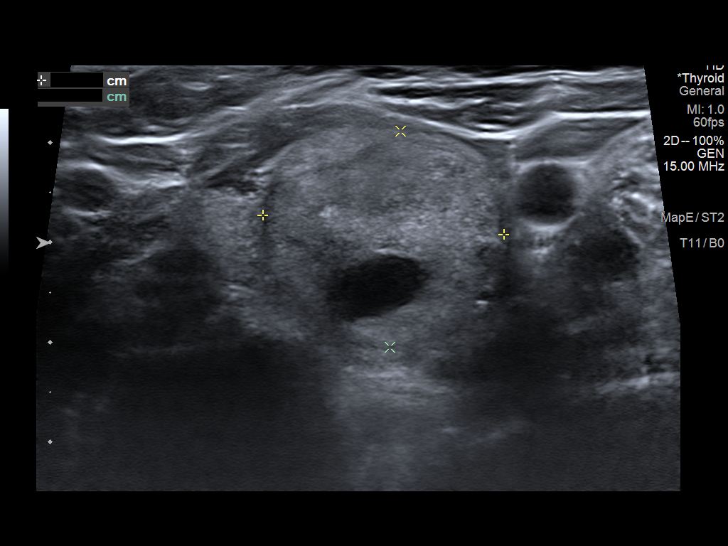
[im 4/11]
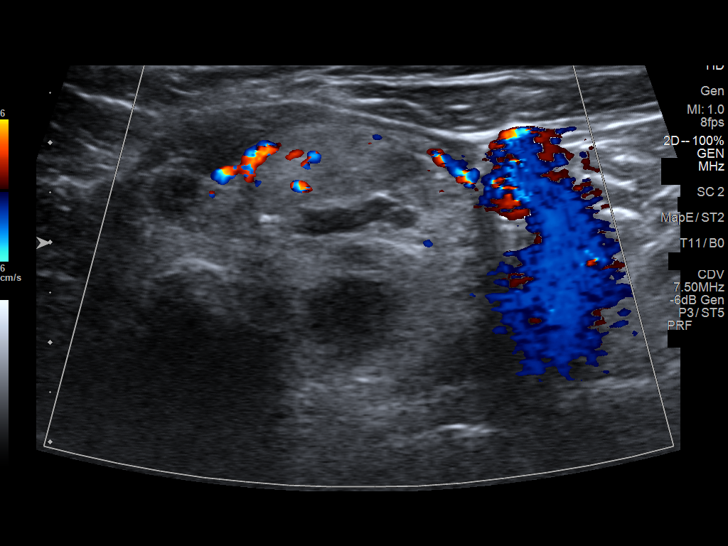
[im 5/11]
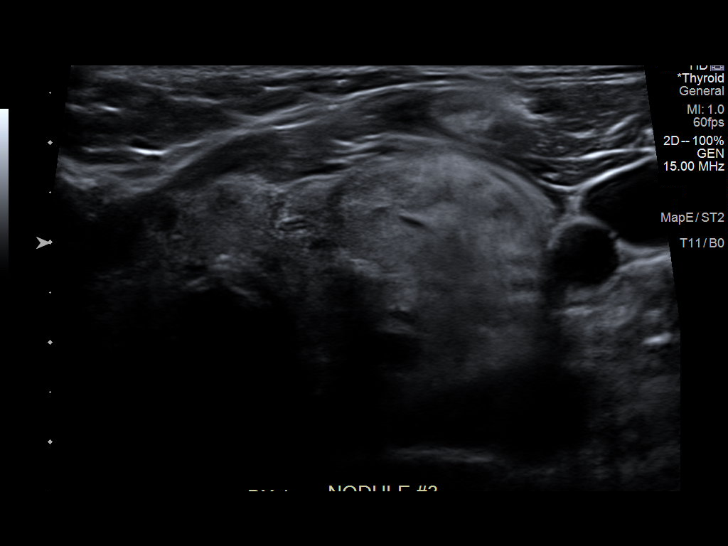
[im 6/11]
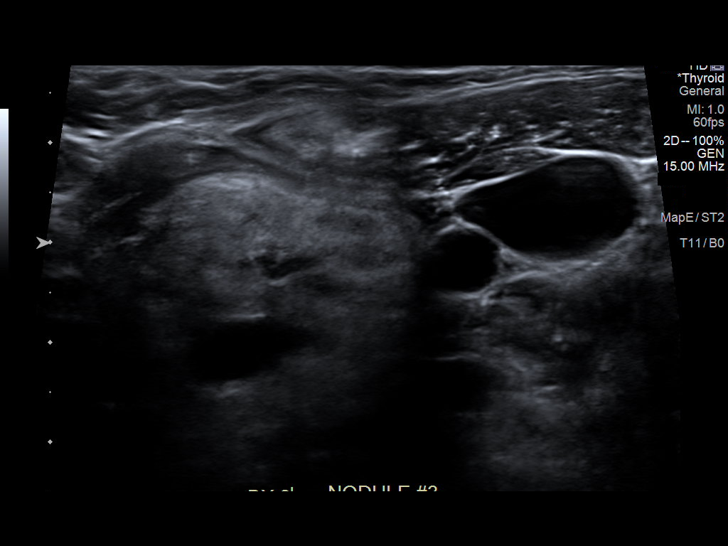
[im 7/11]
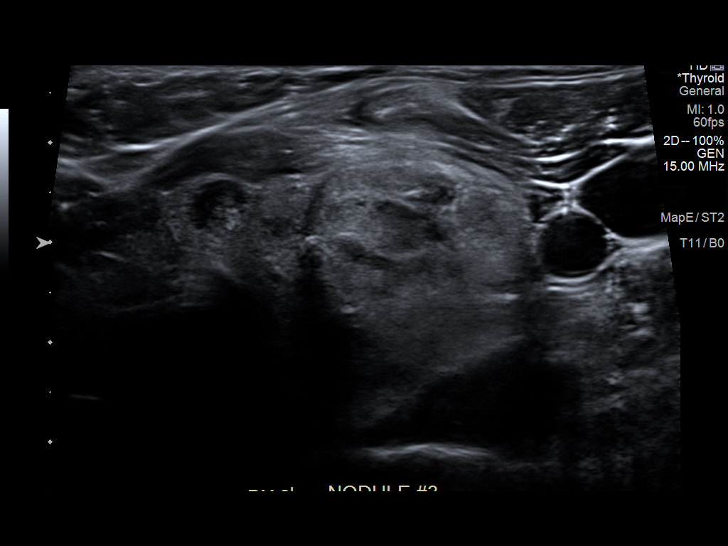
[im 8/11]
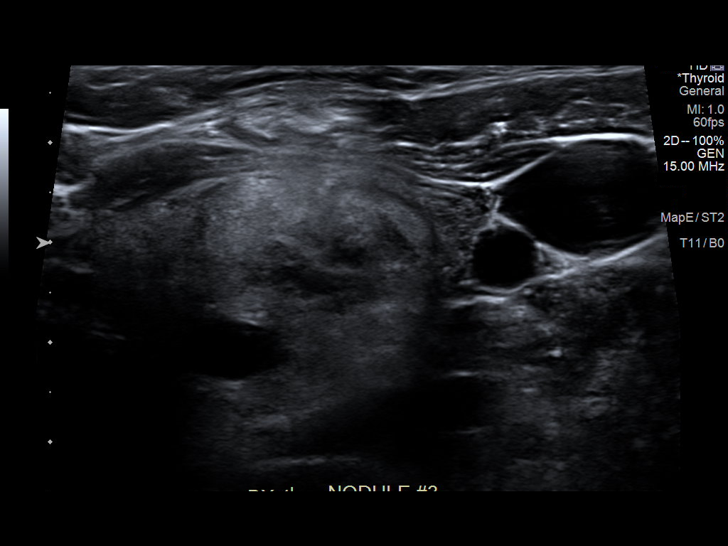
[im 9/11]
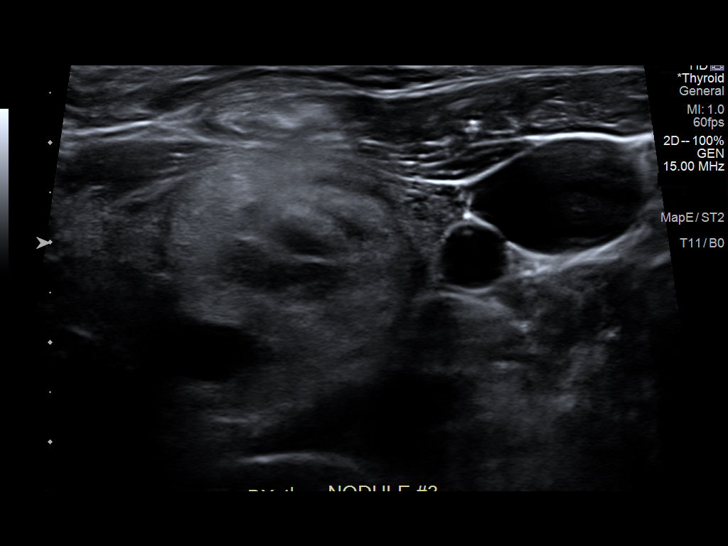
[im 10/11]
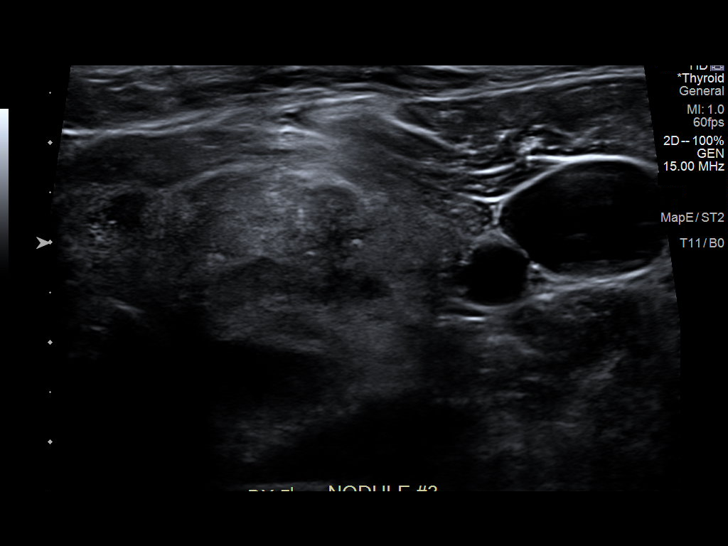
[im 11/11]
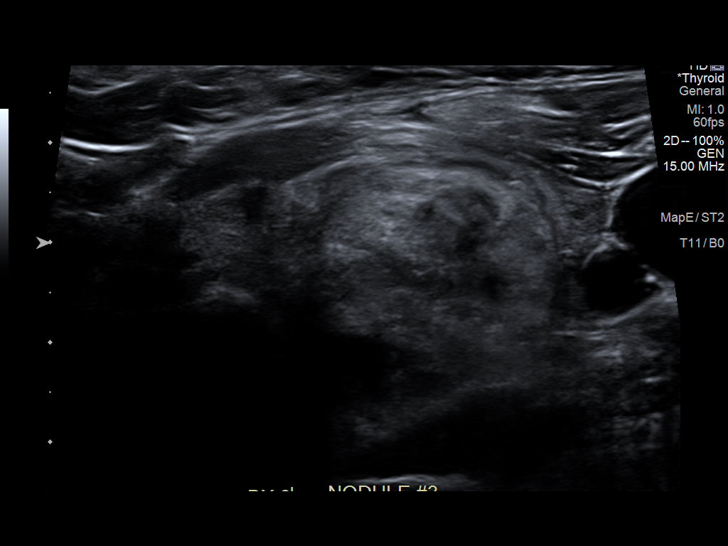

[11 of 11 positions shown; findings below may reference images not displayed]

Pre-procedural ultrasound scanning demonstrated unchanged size and
appearance of the indeterminate nodule within the left thyroid lobe.

The procedure was planned. The neck was prepped in the usual sterile
fashion, and a sterile drape was applied covering the operative
field. A timeout was performed prior to the initiation of the
procedure. Local anesthesia was provided with 1% lidocaine.

Under direct ultrasound guidance, 6 FNA biopsies were performed of
the left inferior thyroid nodule with a 25 gauge needle. Multiple
ultrasound images were saved for procedural documentation purposes.
The samples were prepared and submitted to pathology.

Limited post procedural scanning was negative for hematoma or
additional complication. Dressings were placed. The patient
tolerated the above procedures procedure well without immediate
postprocedural complication.
FINDINGS: FINDINGS
Nodule reference number based on prior diagnostic ultrasound: 3

Maximum size: 4.2 cm

Location: Left;  inferior

ACR TI-RADS total points: 3

ACR TI-RADS risk category:  TR3 (3 points)

Prior biopsy:  No

Reason for biopsy: meets ACR TI-RADS criteria

Ultrasound imaging confirms appropriate placement of the needles
within the thyroid nodule.
IMPRESSION: Technically successful ultrasound guided fine needle aspiration
biopsy of the left inferior thyroid nodule.

This exam was performed by Jese Colletti, and was supervised
and interpreted by Dr. Tiger.

## 2024-07-04 ENCOUNTER — Other Ambulatory Visit: Payer: Self-pay

## 2024-07-04 ENCOUNTER — Emergency Department
Admission: EM | Admit: 2024-07-04 | Discharge: 2024-07-04 | Disposition: A | Discharge status: Home / Self Care | Payer: PRIVATE HEALTH INSURANCE | Attending: Emergency Medicine | Admitting: Emergency Medicine

## 2024-07-04 ENCOUNTER — Emergency Department: Payer: PRIVATE HEALTH INSURANCE

## 2024-07-04 DIAGNOSIS — M25562 Pain in left knee: Secondary | ICD-10-CM | POA: Diagnosis not present

## 2024-07-04 DIAGNOSIS — M545 Low back pain, unspecified: Secondary | ICD-10-CM | POA: Diagnosis not present

## 2024-07-04 DIAGNOSIS — Y92410 Unspecified street and highway as the place of occurrence of the external cause: Secondary | ICD-10-CM | POA: Insufficient documentation

## 2024-07-04 DIAGNOSIS — M791 Myalgia, unspecified site: Secondary | ICD-10-CM | POA: Insufficient documentation

## 2024-07-04 DIAGNOSIS — M542 Cervicalgia: Secondary | ICD-10-CM | POA: Diagnosis present

## 2024-07-04 DIAGNOSIS — M7918 Myalgia, other site: Secondary | ICD-10-CM

## 2024-07-04 MED ORDER — NAPROXEN 500 MG PO TABS
500.0000 mg | ORAL_TABLET | Freq: Once | ORAL | Status: AC
  Administered 2024-07-04: 500 mg via ORAL
  Filled 2024-07-04: qty 1

## 2024-07-04 MED ORDER — HYDROCODONE-ACETAMINOPHEN 5-325 MG PO TABS
1.0000 | ORAL_TABLET | Freq: Once | ORAL | Status: AC
  Administered 2024-07-04: 1 via ORAL
  Filled 2024-07-04: qty 1

## 2024-07-04 MED ORDER — CYCLOBENZAPRINE HCL 5 MG PO TABS: 5.0000 mg | ORAL_TABLET | Freq: Three times a day (TID) | ORAL | 0 refills | Status: AC | PRN

## 2024-07-04 MED ORDER — NAPROXEN 500 MG PO TABS: 500.0000 mg | ORAL_TABLET | Freq: Two times a day (BID) | ORAL | 0 refills | Status: AC

## 2024-07-04 MED ORDER — CYCLOBENZAPRINE HCL 10 MG PO TABS
10.0000 mg | ORAL_TABLET | Freq: Once | ORAL | Status: AC
Start: 2024-07-04 — End: 2024-07-04
  Administered 2024-07-04: 5 mg via ORAL
  Filled 2024-07-04: qty 1
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
## 2024-07-04 NOTE — Discharge Instructions (Addendum)

## 2024-07-04 NOTE — ED Notes (Signed)
 PA, Jenise at bedside.

## 2024-07-04 NOTE — ED Notes (Signed)
 Patient transported to X-ray

## 2024-07-04 NOTE — ED Notes (Addendum)
 Pam Hayes
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
